# Patient Record
Sex: Male | Born: 1960 | Race: White | Hispanic: No | Marital: Married | Smoking: Former smoker
Health system: Southern US, Community
[De-identification: ages and names within clinical notes are randomized; demographics above are authoritative.]

## PROBLEM LIST (undated history)

## (undated) DIAGNOSIS — K219 Gastro-esophageal reflux disease without esophagitis: Secondary | ICD-10-CM

## (undated) DIAGNOSIS — I10 Essential (primary) hypertension: Secondary | ICD-10-CM

---

## 2014-06-08 ENCOUNTER — Ambulatory Visit: Payer: Self-pay | Admitting: Physician Assistant

## 2014-09-08 ENCOUNTER — Encounter: Payer: Self-pay | Admitting: *Deleted

## 2014-09-08 ENCOUNTER — Emergency Department
Admission: EM | Admit: 2014-09-08 | Discharge: 2014-09-08 | Disposition: A | Discharge status: Home / Self Care | Payer: BLUE CROSS/BLUE SHIELD | Attending: Student | Admitting: Student

## 2014-09-08 ENCOUNTER — Emergency Department: Payer: BLUE CROSS/BLUE SHIELD

## 2014-09-08 DIAGNOSIS — R0602 Shortness of breath: Secondary | ICD-10-CM | POA: Insufficient documentation

## 2014-09-08 DIAGNOSIS — R0789 Other chest pain: Secondary | ICD-10-CM | POA: Diagnosis present

## 2014-09-08 DIAGNOSIS — Z87891 Personal history of nicotine dependence: Secondary | ICD-10-CM | POA: Insufficient documentation

## 2014-09-08 DIAGNOSIS — R079 Chest pain, unspecified: Secondary | ICD-10-CM

## 2014-09-08 LAB — CBC
HEMATOCRIT: 48.1 % (ref 40.0–52.0)
Hemoglobin: 16.2 g/dL (ref 13.0–18.0)
MCH: 31.1 pg (ref 26.0–34.0)
MCHC: 33.6 g/dL (ref 32.0–36.0)
MCV: 92.7 fL (ref 80.0–100.0)
Platelets: 277 10*3/uL (ref 150–440)
RBC: 5.19 MIL/uL (ref 4.40–5.90)
RDW: 13.8 % (ref 11.5–14.5)
WBC: 9.6 10*3/uL (ref 3.8–10.6)

## 2014-09-08 LAB — BASIC METABOLIC PANEL
Anion gap: 7 (ref 5–15)
BUN: 21 mg/dL — ABNORMAL HIGH (ref 6–20)
CO2: 27 mmol/L (ref 22–32)
CREATININE: 1.12 mg/dL (ref 0.61–1.24)
Calcium: 9.1 mg/dL (ref 8.9–10.3)
Chloride: 107 mmol/L (ref 101–111)
Glucose, Bld: 93 mg/dL (ref 65–99)
Potassium: 4.1 mmol/L (ref 3.5–5.1)
SODIUM: 141 mmol/L (ref 135–145)

## 2014-09-08 LAB — TROPONIN I: Troponin I: <0.03 ng/mL (ref <0.031)

## 2014-09-08 MED ORDER — ASPIRIN 81 MG PO CHEW
CHEWABLE_TABLET | ORAL | Status: AC
  Administered 2014-09-08: 324 mg via ORAL
  Filled 2014-09-08: qty 4

## 2014-09-08 MED ORDER — ASPIRIN 81 MG PO CHEW
324.0000 mg | CHEWABLE_TABLET | Freq: Once | ORAL | Status: AC
  Administered 2014-09-08: 324 mg via ORAL

## 2014-09-08 NOTE — Discharge Instructions (Signed)
Return immediately for severe or worsening chest pressure or shortness of breath, lightheadedness, sudden sweating, nausea vomiting, if your pain starts to radiate into your neck arms or back, fainting, fevers, abdominal pain, vomiting, or for any other concerns.

## 2014-09-08 NOTE — ED Provider Notes (Signed)
[facility] Emergency Department Provider Note  ____________________________________________  Time seen: Approximately 9:05 PM  I have reviewed the triage vital signs and the nursing notes.   HISTORY  Chief Complaint Chest Pain    HPI Cole Elliott is a 54 y.o. male with history of depression, no other chronic medical problems, history of smoking but quit 10 years ago who presents for evaluation of 2 weeks of constant anterior chest tightness/pressure, gradual onset, not worsened with exertion, not pleuritic. He reports he does not have one moment without the discomfort. It typically improves when he gets up and moves around. Chest pressure is associated with some mild shortness of breath but is not associated with any diaphoresis, no nausea, no vomiting, no lightheadedness or fainting. Current severity is mild. No modifying factors. The chest pressure is nonradiating. He has no history of coronary artery disease, no family history of coronary artery disease, he has no history of PE or DVT, no hemoptysis, no estrogen aor testosterone use, no recent surgeries, no recent period of Immobilization. His wife returned from a trip from the [place] today, he told her of his symptoms and she forced him to come to the emergency department.   History reviewed. No pertinent past medical history.  There are no active problems to display for this patient.   History reviewed. No pertinent past surgical history.  No current outpatient prescriptions on file.  Allergies Review of patient's allergies indicates no known allergies.  No family history on file.  Social History History  Substance Use Topics  . Smoking status: Former Games developer  . Smokeless tobacco: Not on file  . Alcohol Use: No    Review of Systems Constitutional: No fever/chills Eyes: No visual changes. ENT: No sore throat. Cardiovascular: + chest pain. Respiratory: +shortness of  breath. Gastrointestinal: No abdominal pain.  No nausea, no vomiting.  No diarrhea.  No constipation. Genitourinary: Negative for dysuria. Musculoskeletal: Negative for back pain. Skin: Negative for rash. Neurological: Negative for headaches, focal weakness or numbness.  10-point ROS otherwise negative.  ____________________________________________   PHYSICAL EXAM:  VITAL SIGNS: ED Triage Vitals  Enc Vitals Group     BP 09/08/14 1640 151/83 mmHg     Pulse Rate 09/08/14 1640 92     Resp 09/08/14 1640 20     Temp 09/08/14 1640 97.8 F (36.6 C)     Temp Source 09/08/14 1640 Oral     SpO2 09/08/14 1640 94 %     Weight 09/08/14 1640 272 lb (123.378 kg)     Height 09/08/14 1640 6' (1.829 m)     Head Cir --      Peak Flow --      Pain Score 09/08/14 1639 2     Pain Loc --      Pain Edu? --      Excl. in GC? --     Constitutional: Alert and oriented. Well appearing and in no acute distress. Eyes: Conjunctivae are normal. PERRL. EOMI. Head: Atraumatic. Nose: No congestion/rhinnorhea. Mouth/Throat: Mucous membranes are moist.  Oropharynx non-erythematous. Neck: No stridor.  Cardiovascular: Normal rate, regular rhythm. Grossly normal heart sounds.  Good peripheral circulation. Respiratory: Normal respiratory effort.  No retractions. Lungs CTAB. Gastrointestinal: Soft and nontender. No distention. No abdominal bruits. No CVA tenderness. Genitourinary: deferred Musculoskeletal: No lower extremity tenderness nor edema.  No joint effusions. Neurologic:  Normal speech and language. No gross focal neurologic deficits are appreciated. Speech is normal. No gait instability. Skin:  Skin  is warm, dry and intact. No rash noted. Psychiatric: Mood and affect are normal. Speech and behavior are normal.  ____________________________________________   LABS (all labs ordered are listed, but only abnormal results are displayed)  Labs Reviewed  BASIC METABOLIC PANEL - Abnormal; Notable for  the following:    BUN 21 (*)    All other components within normal limits  CBC  TROPONIN I   ____________________________________________  EKG  ED ECG REPORT I, Gayla Doss, the attending physician, personally viewed and interpreted this ECG.   Date: 09/08/2014  EKG Time: 16:43  Rate: 89  Rhythm: normal sinus rhythm  Axis: Normal axis  Intervals:none  ST&T Change: No acute ST segment elevation  ____________________________________________  RADIOLOGY  CXR IMPRESSION: 6 mm nodular opacity in the right upper lobe near the apex. Advise noncontrast enhanced chest CT to further assess. Lungs elsewhere clear. Heart size and pulmonary vascularity are normal. No adenopathy.   ____________________________________________   PROCEDURES  Procedure(s) performed: None  Critical Care performed: No  ____________________________________________   INITIAL IMPRESSION / ASSESSMENT AND PLAN / ED COURSE  Pertinent labs & imaging results that were available during my care of the patient were reviewed by me and considered in my medical decision making (see chart for details).  Cole Elliott is a 54 y.o. male with history of depression, no other chronic medical problems, history of smoking but quit 10 years ago who presents for evaluation of 2 weeks of constant anterior chest tightness/pressure, gradual onset, not worsened with exertion, not pleuritic. On Exam, he is very well-appearing and in no acute distress. Vital signs stable, he is afebrile. In the setting of continued/constant chest pressure for the past 2 weeks, no exertional component, doubt ACS. Heart score is 1 because of prior smoking history, troponin negative, EKG reassuring. He is not hypoxic, no increased work of breathing, no tachypnea, no hemoptysis, no risk factors for PE, no pleuritic component to his chest pain and I doubt PE as a diagnosis. Chest pain is not ripping or tearing in nature, does not radiate to the  back or down towards the feet and I doubt acute aortic dissection. Aspirin given. Discussed that he needs to follow-up soon with a primary care doctor. Discussed return precautions and he is comfortable with discharge plan, as is his wife. ____________________________________________   FINAL CLINICAL IMPRESSION(S) / ED DIAGNOSES  Final diagnoses:  Chest pain, unspecified chest pain type  SOB (shortness of breath)      Gayla Doss, MD 09/08/14 2255

## 2014-09-08 NOTE — ED Notes (Signed)
Past few weeks c/o chest pressure, some sob

## 2016-11-12 ENCOUNTER — Ambulatory Visit
Admission: EM | Admit: 2016-11-12 | Discharge: 2016-11-12 | Disposition: A | Discharge status: Home / Self Care | Payer: BLUE CROSS/BLUE SHIELD | Attending: Family Medicine | Admitting: Family Medicine

## 2016-11-12 ENCOUNTER — Encounter: Payer: Self-pay | Admitting: *Deleted

## 2016-11-12 DIAGNOSIS — Z024 Encounter for examination for driving license: Secondary | ICD-10-CM

## 2016-11-12 HISTORY — DX: Essential (primary) hypertension: I10

## 2016-11-12 HISTORY — DX: Gastro-esophageal reflux disease without esophagitis: K21.9

## 2016-11-12 LAB — DEPT OF TRANSP DIPSTICK, URINE (ARMC ONLY)
Glucose, UA: NEGATIVE mg/dL
HGB URINE DIPSTICK: NEGATIVE
Protein, ur: NEGATIVE mg/dL
Specific Gravity, Urine: 1.025 (ref 1.005–1.030)

## 2016-11-12 NOTE — ED Provider Notes (Signed)
[facility]    CSN: [id number] Arrival date & time: 11/12/16  1301     History   Chief Complaint Chief Complaint  Patient presents with  . Commercial Driver's License Exam    HPI Cole Elliott is a 56 y.o. male.   Patient's here for DOT. His hypertension on blood pressure medicine   The history is provided by the patient. No language interpreter was used.    Past Medical History:  Diagnosis Date  . GERD (gastroesophageal reflux disease)   . Hypertension     There are no active problems to display for this patient.   History reviewed. No pertinent surgical history.     Home Medications    Prior to Admission medications   Medication Sig Start Date End Date Taking? Authorizing Provider  aspirin EC 81 MG tablet Take 81 mg by mouth daily.   Yes [provider]  Glucosamine-Chondroitin-MSM-D (TRIPLE FLEX/VITAMIN D3 PO) Take by mouth daily.   Yes [provider]  lansoprazole (PREVACID) 30 MG capsule Take 30 mg by mouth 2 (two) times daily before a meal.   Yes [provider]  losartan (COZAAR) 50 MG tablet Take 50 mg by mouth daily.   Yes [provider]  Multiple Vitamins-Minerals (CENTRUM SILVER PO) Take by mouth daily.   Yes [provider]    Family History History reviewed. No pertinent family history.  Social History Social History  Substance Use Topics  . Smoking status: Former Games developermoker  . Smokeless tobacco: Never Used  . Alcohol use No     Allergies   Patient has no known allergies.   Review of Systems Review of Systems  All other systems reviewed and are negative.    Physical Exam Triage Vital Signs ED Triage Vitals  Enc Vitals Group     BP 11/12/16 1321 124/74     Pulse Rate 11/12/16 1321 89     Resp 11/12/16 1321 16     Temp 11/12/16 1321 98.4 F (36.9 C)     Temp Source 11/12/16 1321 Oral     SpO2 11/12/16 1321 97 %     Weight 11/12/16 1321 268 lb (121.6 kg)     Height  11/12/16 1321 6' (1.829 m)     Head Circumference --      Peak Flow --      Pain Score 11/12/16 1322 0     Pain Loc --      Pain Edu? --      Excl. in GC? --    No data found.   Updated Vital Signs BP 124/74 (BP Location: Left Arm)   Pulse 89   Temp 98.4 F (36.9 C) (Oral)   Resp 16   Ht 6' (1.829 m)   Wt 268 lb (121.6 kg)   SpO2 97%   BMI 36.35 kg/m   Visual Acuity Right Eye Distance:   Left Eye Distance:   Bilateral Distance:    Right Eye Near:   Left Eye Near:    Bilateral Near:     Physical Exam  Constitutional: He is oriented to person, place, and time. He appears well-developed and well-nourished.  HENT:  Head: Normocephalic and atraumatic.  Right Ear: External ear normal.  Left Ear: External ear normal.  Eyes: Pupils are equal, round, and reactive to light.  Neck: Normal range of motion. Neck supple.  Cardiovascular: Normal rate and regular rhythm.   Pulmonary/Chest: Effort normal.  Abdominal: Hernia confirmed negative in the  right inguinal area and confirmed negative in the left inguinal area.  Genitourinary: Testes normal and penis normal. Uncircumcised.  Musculoskeletal: Normal range of motion.  Neurological: He is alert and oriented to person, place, and time. No cranial nerve deficit.  Skin: Skin is warm.  Psychiatric: He has a normal mood and affect.  Vitals reviewed.    UC Treatments / Results  Labs (all labs ordered are listed, but only abnormal results are displayed) Labs Reviewed  DEPT OF TRANSP DIPSTICK, URINE([facility] ONLY)    EKG  EKG Interpretation None       Radiology No results found.  Procedures Procedures (including critical care time)  Medications Ordered in UC Medications - No data to display   Initial Impression / Assessment and Plan / UC Course  I have reviewed the triage vital signs and the nursing notes.  Pertinent labs & imaging results that were available during my care of the patient were  reviewed by me and considered in my medical decision making (see chart for details).    Patient given a year DOT  Final Clinical Impressions(s) / UC Diagnoses   Final diagnoses:  Encounter for commercial driver medical examination (CDME)    New Prescriptions New Prescriptions   No medications on file   Note: This dictation was prepared with Dragon dictation along with smaller phrase technology. Any transcriptional errors that result from this process are unintentional. Controlled Substance Prescriptions [place] Controlled Substance Registry consulted? Not Applicable   Hassan Rowan, MD 11/12/16 [phone]

## 2016-11-12 NOTE — ED Triage Notes (Signed)
Commercial drivers license physical exam 

## 2017-03-01 ENCOUNTER — Encounter: Payer: Self-pay | Admitting: Emergency Medicine

## 2017-03-01 ENCOUNTER — Emergency Department
Admission: EM | Admit: 2017-03-01 | Discharge: 2017-03-02 | Disposition: A | Discharge status: Home / Self Care | Payer: BLUE CROSS/BLUE SHIELD | Attending: Emergency Medicine | Admitting: Emergency Medicine

## 2017-03-01 ENCOUNTER — Other Ambulatory Visit: Payer: Self-pay

## 2017-03-01 ENCOUNTER — Emergency Department: Payer: BLUE CROSS/BLUE SHIELD

## 2017-03-01 DIAGNOSIS — Z87891 Personal history of nicotine dependence: Secondary | ICD-10-CM | POA: Insufficient documentation

## 2017-03-01 DIAGNOSIS — Z7982 Long term (current) use of aspirin: Secondary | ICD-10-CM | POA: Diagnosis not present

## 2017-03-01 DIAGNOSIS — K529 Noninfective gastroenteritis and colitis, unspecified: Secondary | ICD-10-CM | POA: Diagnosis not present

## 2017-03-01 DIAGNOSIS — I10 Essential (primary) hypertension: Secondary | ICD-10-CM | POA: Insufficient documentation

## 2017-03-01 DIAGNOSIS — Z79899 Other long term (current) drug therapy: Secondary | ICD-10-CM | POA: Insufficient documentation

## 2017-03-01 DIAGNOSIS — R112 Nausea with vomiting, unspecified: Secondary | ICD-10-CM | POA: Diagnosis present

## 2017-03-01 LAB — CBC
HCT: 51.1 % (ref 40.0–52.0)
Hemoglobin: 17.4 g/dL (ref 13.0–18.0)
MCH: 30.9 pg (ref 26.0–34.0)
MCHC: 33.9 g/dL (ref 32.0–36.0)
MCV: 91.1 fL (ref 80.0–100.0)
Platelets: 297 10*3/uL (ref 150–440)
RBC: 5.61 MIL/uL (ref 4.40–5.90)
RDW: 13.7 % (ref 11.5–14.5)
WBC: 15.3 10*3/uL — ABNORMAL HIGH (ref 3.8–10.6)

## 2017-03-01 LAB — URINALYSIS, COMPLETE (UACMP) WITH MICROSCOPIC
Bacteria, UA: NONE SEEN
Bilirubin Urine: NEGATIVE
GLUCOSE, UA: NEGATIVE mg/dL
Ketones, ur: NEGATIVE mg/dL
Leukocytes, UA: NEGATIVE
Nitrite: NEGATIVE
PROTEIN: 30 mg/dL — AB
Specific Gravity, Urine: 1.031 — ABNORMAL HIGH (ref 1.005–1.030)
pH: 5 (ref 5.0–8.0)

## 2017-03-01 LAB — COMPREHENSIVE METABOLIC PANEL
ALK PHOS: 75 U/L (ref 38–126)
ALT: 37 U/L (ref 17–63)
AST: 35 U/L (ref 15–41)
Albumin: 4.4 g/dL (ref 3.5–5.0)
Anion gap: 9 (ref 5–15)
BUN: 26 mg/dL — AB (ref 6–20)
CALCIUM: 9 mg/dL (ref 8.9–10.3)
CO2: 24 mmol/L (ref 22–32)
Chloride: 106 mmol/L (ref 101–111)
Creatinine, Ser: 0.99 mg/dL (ref 0.61–1.24)
GFR calc Af Amer: >60 mL/min (ref >60)
GFR calc non Af Amer: >60 mL/min (ref >60)
Glucose, Bld: 132 mg/dL — ABNORMAL HIGH (ref 65–99)
Potassium: 3.4 mmol/L — ABNORMAL LOW (ref 3.5–5.1)
Sodium: 139 mmol/L (ref 135–145)
Total Bilirubin: 0.9 mg/dL (ref 0.3–1.2)
Total Protein: 7.6 g/dL (ref 6.5–8.1)

## 2017-03-01 LAB — LIPASE, BLOOD: Lipase: 23 U/L (ref 11–51)

## 2017-03-01 MED ORDER — SODIUM CHLORIDE 0.9 % IV BOLUS (SEPSIS)
1000.0000 mL | Freq: Once | INTRAVENOUS | Status: AC
  Administered 2017-03-01: 1000 mL via INTRAVENOUS

## 2017-03-01 MED ORDER — MORPHINE SULFATE (PF) 2 MG/ML IV SOLN
2.0000 mg | Freq: Once | INTRAVENOUS | Status: AC
  Administered 2017-03-01: 2 mg via INTRAVENOUS
  Filled 2017-03-01: qty 1

## 2017-03-01 MED ORDER — IOPAMIDOL (ISOVUE-300) INJECTION 61%
30.0000 mL | Freq: Once | INTRAVENOUS | Status: AC
  Administered 2017-03-01: 30 mL via ORAL

## 2017-03-01 MED ORDER — ONDANSETRON HCL 4 MG/2ML IJ SOLN
4.0000 mg | Freq: Once | INTRAMUSCULAR | Status: AC
  Administered 2017-03-01: 4 mg via INTRAVENOUS
  Filled 2017-03-01: qty 2

## 2017-03-01 MED ORDER — SODIUM CHLORIDE 0.9 % IV BOLUS (SEPSIS)
1000.0000 mL | Freq: Once | INTRAVENOUS | Status: AC
  Administered 2017-03-02: 1000 mL via INTRAVENOUS

## 2017-03-01 MED ORDER — IOPAMIDOL (ISOVUE-300) INJECTION 61%
100.0000 mL | Freq: Once | INTRAVENOUS | Status: AC | PRN
  Administered 2017-03-01: 100 mL via INTRAVENOUS

## 2017-03-01 NOTE — ED Notes (Signed)
Pt back from ct

## 2017-03-01 NOTE — ED Triage Notes (Signed)
First nurse note, pt states that he has been nauseated and vomiting, states that he thinks he either has a stomach bug or ate something bad, pt refused wheelchair and states that he thinks he may be getting dehydrated

## 2017-03-01 NOTE — ED Provider Notes (Addendum)
[facility] Emergency Department Provider Note   ____________________________________________   First MD Initiated Contact with Patient 03/01/17 2044     (approximate)  I have reviewed the triage vital signs and the nursing notes.   HISTORY  Chief Complaint Abdominal Pain and Emesis    HPI Cole Elliott is a 56 y.o. male Patient reports onset of nausea and vomiting earlier today. His abdomen is slightly distended. He reports his belly is achy thinks it might be from vomiting so much straining his muscles. He has a low-grade fever.abdominal pain is mild to moderate. Achy in nature not crampy.   Past Medical History:  Diagnosis Date  . GERD (gastroesophageal reflux disease)   . Hypertension     There are no active problems to display for this patient.   History reviewed. No pertinent surgical history.  Prior to Admission medications   Medication Sig Start Date End Date Taking? Authorizing Provider  lansoprazole (PREVACID) 30 MG capsule Take 30 mg by mouth 2 (two) times daily before a meal.   Yes [provider]  losartan (COZAAR) 50 MG tablet Take 50 mg by mouth daily.   Yes [provider]  aspirin EC 81 MG tablet Take 81 mg by mouth daily.    [provider]  Glucosamine-Chondroitin-MSM-D (TRIPLE FLEX/VITAMIN D3 PO) Take 1 capsule by mouth daily.     [provider]  Multiple Vitamins-Minerals (CENTRUM SILVER PO) Take 1 tablet by mouth daily.     [provider]  ondansetron (ZOFRAN ODT) 4 MG disintegrating tablet Take 1 tablet (4 mg total) by mouth every 8 (eight) hours as needed for nausea or vomiting. 03/02/17   Arnaldo NatalMalinda, Paul F, MD    Allergies Patient has no known allergies.  History reviewed. No pertinent family history.  Social History Social History   Tobacco Use  . Smoking status: Former Games developermoker  . Smokeless tobacco: Never Used  Substance Use Topics  . Alcohol use: No  . Drug use:  No    Review of Systems  Constitutional: No fever/chillsthat he knows of Eyes: No visual changes. ENT: No sore throat. Cardiovascular: Denies chest pain. Respiratory: Denies shortness of breath. Gastrointestinal: see history of present illness Genitourinary: Negative for dysuria. Musculoskeletal: Negative for back pain. Skin: Negative for rash. Neurological: Negative for headaches, focal weakness  ____________________________________________   PHYSICAL EXAM:  VITAL SIGNS: ED Triage Vitals  Enc Vitals Group     BP 03/01/17 1919 (!) 175/89     Pulse Rate 03/01/17 1919 (!) 125     Resp 03/01/17 1919 20     Temp 03/01/17 1919 99.6 F (37.6 C)     Temp Source 03/01/17 1919 Oral     SpO2 03/01/17 1919 95 %     Weight 03/01/17 1920 268 lb (121.6 kg)     Height 03/01/17 1920 6' (1.829 m)     Head Circumference --      Peak Flow --      Pain Score --      Pain Loc --      Pain Edu? --      Excl. in GC? --     Constitutional: Alert and oriented. Well appearing and in no acute distress. Eyes: Conjunctivae are normal.  Head: Atraumatic. Nose: No congestion/rhinnorhea. Mouth/Throat: Mucous membranes are moist.  Oropharynx non-erythematous. Neck: No stridor. Cardiovascular: Normal rate, regular rhythm. Grossly normal heart sounds.  Good peripheral circulation. Respiratory: Normal respiratory effort.  No retractions. Lungs CTAB.  Gastrointestinal: Soft andmildly diffusely tender to palpation only. No distention. No abdominal bruits. No CVA tenderness. Musculoskeletal: No lower extremity tenderness nor edema.  No joint effusions. Neurologic:  Normal speech and language. No gross focal neurologic deficits are appreciated. No gait instability. Skin:  Skin is warm, dry and intact. No rash noted. Psychiatric: Mood and affect are normal. Speech and behavior are normal.  ____________________________________________   LABS (all labs ordered are listed, but only abnormal results are  displayed)  Labs Reviewed  COMPREHENSIVE METABOLIC PANEL - Abnormal; Notable for the following components:      Result Value   Potassium 3.4 (*)    Glucose, Bld 132 (*)    BUN 26 (*)    All other components within normal limits  CBC - Abnormal; Notable for the following components:   WBC 15.3 (*)    All other components within normal limits  URINALYSIS, COMPLETE (UACMP) WITH MICROSCOPIC - Abnormal; Notable for the following components:   Color, Urine YELLOW (*)    APPearance CLEAR (*)    Specific Gravity, Urine 1.031 (*)    Hgb urine dipstick SMALL (*)    Protein, ur 30 (*)    Squamous Epithelial / LPF 0-5 (*)    All other components within normal limits  LIPASE, BLOOD   ____________________________________________  EKG   ____________________________________________  RADIOLOGY  Ct Abdomen Pelvis W Contrast  Result Date: 03/01/2017 CLINICAL DATA:  Abdominal distention with nausea and vomiting EXAM: CT ABDOMEN AND PELVIS WITH CONTRAST TECHNIQUE: Multidetector CT imaging of the abdomen and pelvis was performed using the standard protocol following bolus administration of intravenous contrast. Oral contrast was also administered. CONTRAST:  100mL ISOVUE-300 IOPAMIDOL (ISOVUE-300) INJECTION 61% COMPARISON:  None. FINDINGS: Lower chest: Lung bases are clear. There is a hiatal hernia with reflux into the distal esophagus. There is wall thickening in the hiatal hernia. Hepatobiliary: There is a degree of hepatic steatosis. No focal liver lesions are evident. There is no appreciable gallbladder wall thickening. No biliary duct dilatation. Pancreas: There is no pancreatic mass or inflammatory focus. No pancreatic lesions are evident. Spleen: No splenic lesions are evident. Adrenals/Urinary Tract: Adrenals bilaterally appear normal. Kidneys bilaterally show no evident mass or hydronephrosis on either side. There is no renal or ureteral calculus on either side. Urinary bladder is midline with  wall thickness within normal limits. Stomach/Bowel: Stomach is distended with fluid. There are multiple loops of bowel which are largely filled with fluid. There is dilatation of most of the loops of jejunum. There is a relative transition zone in the proximal to mid ileum suggesting a degree of bowel obstruction. No free air or portal venous air. There is no colonic dilatation. There is fluid throughout much of the colon. Vascular/Lymphatic: There are foci of atherosclerotic calcification in the aorta and common iliac arteries. No aneurysm evident. Major mesenteric vessels appear intact. There is no mesenteric vessel obstruction. There is no appreciable adenopathy in the abdomen or pelvis. Reproductive: Prostate and seminal vesicles are normal in size and contour. There is no pelvic mass. Other: Appendix appears normal. There is no ascites or abscess in the abdomen or pelvis. There is a rather minimal ventral hernia containing only fat. There is fat in each inguinal ring. Musculoskeletal: There is degenerative change in the lumbar spine with vacuum phenomenon at L5-S1. There are no blastic or lytic bone lesions. There is no intramuscular or abdominal wall lesion. IMPRESSION: 1. Dilatation of the stomach and small bowel to the mid right abdominal  region where there is a relative transition zone in the proximal to mid ileum, felt to represent a degree of small bowel obstruction. No free air or portal venous air. Suspect a degree of underlying enterocolitis within the degree of fluid throughout the small bowel and colon. 2.  Appendix appears normal.  No abscess. 3. There is spontaneous reflux into a hiatal hernia. There is wall thickening in the hiatal hernia, likely due to distal esophagitis. 4. Minimal ventral hernia containing fat. Fat noted in each inguinal ring. 5.  No renal or ureteral calculus.  No hydronephrosis. 6.  There is aortoiliac atherosclerosis. 7.  Hepatic steatosis. Aortic Atherosclerosis  (ICD10-I70.0). Electronically Signed   By: Bretta Bang III M.D.   On: 03/01/2017 22:26   surgery will see the patient for his degree of small bowel obstruction ____________________________________________   PROCEDURES  Procedure(s) performed:   Procedures  Critical Care performed:   ____________________________________________   INITIAL IMPRESSION / ASSESSMENT AND PLAN / ED COURSE  CT shows what appears to be a partial small bowel obstruction. Patient is passing stool etc. per rectum at the obstruction is fairly high. His belly still somewhat distended. Surgery will see him most likely will admit him.   ----------------------------------------- 12:12 AM on 03/02/2017 -----------------------------------------  Surgery comes to see the patient feel he does not have a partial small bowel obstruction as he has no history of previous bowel surgery he has been passing gas and stool and otherwise looks okay we will discharge the patient with some Zofran and Pepto-Bismol if need be he looks good at present. patient will return if he gets worse again.  ____________________________________________   FINAL CLINICAL IMPRESSION(S) / ED DIAGNOSES  Final diagnoses:  Nausea and vomiting, intractability of vomiting not specified, unspecified vomiting type  Gastroenteritis     ED Discharge Orders        Ordered    ondansetron (ZOFRAN ODT) 4 MG disintegrating tablet  Every 8 hours PRN     03/02/17 0011       Note:  This document was prepared using Dragon voice recognition software and may include unintentional dictation errors.    Arnaldo Natal, MD 03/01/17 2316    Arnaldo Natal, MD 03/02/17 [phone]

## 2017-03-01 NOTE — ED Triage Notes (Signed)
Pt presents to ED via POV c/o sudden onset generalized abd pain and emesis starting today. Pt states abd pain feels like muscle aching from vomiting.

## 2017-03-02 MED ORDER — ONDANSETRON 4 MG PO TBDP: 4.0000 mg | ORAL_TABLET | Freq: Three times a day (TID) | ORAL | 0 refills | Status: AC | PRN

## 2017-03-02 NOTE — Discharge Instructions (Signed)
use Pepto-Bismol for the diarrhea you can take the Zofran melt on your tongue pill 3 times a day if needed for nausea or vomiting. Return here for fever lightheadedness continuing diarrhea or if you're unable to keep down fluids.

## 2017-03-02 NOTE — Consult Note (Signed)
SURGICAL CONSULTATION NOTE (initial) - cpt: [phone]  HISTORY OF PRESENT ILLNESS (HPI):  56 y.o. male with no prior abdominal surgery, no known or symptomatic hernias, and no family history of GI cancers presented to [facility] ED tonight for evaluation of nausea, non-bloody vomiting, and diarrhea with intermittent wave-like "achy" abdominal discomfort. Patient reports he first developed nausea and vomiting after eating at [employer] ~10:30 pm last night, followed by several episodes of diarrhea. He states his last loose BM was this morning and he hasn't vomited again since presenting to [facility] ED tonight despite drinking large volume oral contrast, but he says he has been passing flatus and feels like he's about to have another BM. He also describes his "achy" epigastric abdominal pain began after several episodes of emesis and has improved over the past several hours. He otherwise denies any symptoms preceding his meal last night, abdominal distention, prior similar symptoms, fever/chills, recent weight loss, CP, or SOB.  Surgery is consulted by ED physician Dr. Darnelle Catalan in this context for evaluation and management of nausea/vomiting with diarrhea.  PAST MEDICAL HISTORY (PMH):  Past Medical History:  Diagnosis Date  . GERD (gastroesophageal reflux disease)   . Hypertension      PAST SURGICAL HISTORY (PSH):  History reviewed. No pertinent surgical history.   MEDICATIONS:  Prior to Admission medications   Medication Sig Start Date End Date Taking? Authorizing Provider  lansoprazole (PREVACID) 30 MG capsule Take 30 mg by mouth 2 (two) times daily before a meal.   Yes [provider]  losartan (COZAAR) 50 MG tablet Take 50 mg by mouth daily.   Yes [provider]  aspirin EC 81 MG tablet Take 81 mg by mouth daily.    [provider]  Glucosamine-Chondroitin-MSM-D (TRIPLE FLEX/VITAMIN D3 PO) Take 1 capsule by mouth daily.     [provider]  Multiple  Vitamins-Minerals (CENTRUM SILVER PO) Take 1 tablet by mouth daily.     [provider]  ondansetron (ZOFRAN ODT) 4 MG disintegrating tablet Take 1 tablet (4 mg total) by mouth every 8 (eight) hours as needed for nausea or vomiting. 03/02/17   Arnaldo Natal, MD     ALLERGIES:  No Known Allergies   SOCIAL HISTORY:  Social History   Socioeconomic History  . Marital status: Married    Spouse name: Not on file  . Number of children: Not on file  . Years of education: Not on file  . Highest education level: Not on file  Social Needs  . Financial resource strain: Not on file  . Food insecurity - worry: Not on file  . Food insecurity - inability: Not on file  . Transportation needs - medical: Not on file  . Transportation needs - non-medical: Not on file  Occupational History  . Not on file  Tobacco Use  . Smoking status: Former Games developer  . Smokeless tobacco: Never Used  Substance and Sexual Activity  . Alcohol use: No  . Drug use: No  . Sexual activity: Not on file  Other Topics Concern  . Not on file  Social History Narrative  . Not on file    The patient currently resides (home / rehab facility / nursing home): Home The patient normally is (ambulatory / bedbound): Ambulatory   FAMILY HISTORY:  History reviewed. No pertinent family history.   REVIEW OF SYSTEMS:  Constitutional: denies weight loss, fever, chills, or sweats  Eyes: denies any other vision changes, history of eye injury  ENT:  denies sore throat, hearing problems  Respiratory: denies shortness of breath, wheezing  Cardiovascular: denies chest pain, palpitations  Gastrointestinal: abdominal pain, N/V, and bowel function as per HPI Genitourinary: denies burning with urination or urinary frequency Musculoskeletal: denies any other joint pains or cramps  Skin: denies any other rashes or skin discolorations  Neurological: denies any other headache, dizziness, weakness  Psychiatric: denies any other  depression, anxiety   All other review of systems were negative   VITAL SIGNS:  Temp:  [99.6 F (37.6 C)] 99.6 F (37.6 C) (12/16 1919) Pulse Rate:  [102-125] 102 (12/16 2304) Resp:  [18-20] 18 (12/16 2304) BP: (129-175)/(82-89) 129/82 (12/16 2304) SpO2:  [95 %] 95 % (12/16 2304) Weight:  [268 lb (121.6 kg)] 268 lb (121.6 kg) (12/16 1920)     Height: 6' (182.9 cm) Weight: 268 lb (121.6 kg) BMI (Calculated): 36.34   INTAKE/OUTPUT:  This shift: No intake/output data recorded.  Last 2 shifts: @IOLAST2SHIFTS @   PHYSICAL EXAM:  Constitutional:  -- Obese body habitus  -- Awake, alert, and oriented x3, no apparent distress Eyes:  -- Pupils equally round and reactive to light  -- No scleral icterus, B/L no occular discharge Ear, nose, throat: -- Neck is FROM WNL -- No jugular venous distension  Pulmonary:  -- No wheezes or rhales -- Equal breath sounds bilaterally -- Breathing non-labored at rest Cardiovascular:  -- S1, S2 present  -- No pericardial rubs  Gastrointestinal:  -- Abdomen soft and obese with minimal vague epigastric tenderness to palpation, non-distended, no guarding or rebound tenderness -- No abdominal masses appreciated, pulsatile or otherwise  Musculoskeletal and Integumentary:  -- Wounds or skin discoloration: None appreciated -- Extremities: B/L UE and LE FROM, hands and feet warm  Neurologic:  -- Motor function: Intact and symmetric -- Sensation: Intact and symmetric Psychiatric:  -- Mood and affect WNL  Labs:  CBC Latest Ref Rng & Units 03/01/2017 09/08/2014  WBC 3.8 - 10.6 K/uL 15.3(H) 9.6  Hemoglobin 13.0 - 18.0 g/dL 09.817.4 11.916.2  Hematocrit 14.740.0 - 52.0 % 51.1 48.1  Platelets 150 - 440 K/uL 297 277   CMP Latest Ref Rng & Units 03/01/2017 09/08/2014  Glucose 65 - 99 mg/dL 829(F132(H) 93  BUN 6 - 20 mg/dL 62(Z26(H) 30(Q21(H)  Creatinine 0.61 - 1.24 mg/dL 6.570.99 8.461.12  Sodium 962135 - 145 mmol/L 139 141  Potassium 3.5 - 5.1 mmol/L 3.4(L) 4.1  Chloride 101 - 111 mmol/L  106 107  CO2 22 - 32 mmol/L 24 27  Calcium 8.9 - 10.3 mg/dL 9.0 9.1  Total Protein 6.5 - 8.1 g/dL 7.6 -  Total Bilirubin 0.3 - 1.2 mg/dL 0.9 -  Alkaline Phos 38 - 126 U/L 75 -  AST 15 - 41 U/L 35 -  ALT 17 - 63 U/L 37 -   Imaging studies:  CT Abdomen and Pelvis with Oral and IV Contrast (03/02/2017) - personally reviewed and discussed with patient and his wife (bedside) Dilatation of the stomach and small bowel to the mid right abdominal region where there is a relative transition zone in the proximal to mid ileum, felt to represent a degree of small bowel obstruction. No free air or portal venous air. Suspect a degree of underlying enterocolitis within the degree of fluid throughout the small bowel and colon. Minimal ventral hernia containing fat.  Fat noted in each inguinal ring.  Assessment/Plan: (ICD-10's: 62K52.9) 56 y.o. male with what seems more likely to be gastroenterocolitis, though partial bowel obstruction cannot be completely  excluded, considering a lack of any prior abdominal surgery, no bowel-containing abdominal wall hernia, and no family history of GI malignancy (and patient's relatively young age), along with ongoing flatus and diarrhea beginning last night without any preceding symptoms or abdominal distention, and fluid-filled colon as well as small intestine, complicated by pertinent comorbidities including morbid obesity (BMI >36), medication-controlled GERD with known hiatal hernia, and HTN.   - no indication for surgical intervention at this time   - importance of hydration discussed with patient and his wife  - if patient discharged by ED and abdominal pain worsens/persists, return to ED; otherwise, follow-up with PMD   - medical management and disposition as per ED physician  All of the above findings and recommendations were discussed with the patient, his wife, and Dr. Darnelle CatalanMalinda (ED physician), and all of patient's and his family's questions were answered to their  expressed satisfaction.  Thank you for the opportunity to participate in this patient's care.   -- Scherrie GerlachJason E. Earlene Plateravis, MD, RPVI [place]: [facility] - Partnering for exceptional care. Office: [phone]

## 2017-08-20 ENCOUNTER — Ambulatory Visit
Admission: RE | Admit: 2017-08-20 | Discharge: 2017-08-20 | Disposition: A | Discharge status: Home / Self Care | Payer: BLUE CROSS/BLUE SHIELD | Source: Ambulatory Visit | Attending: Family Medicine | Admitting: Family Medicine

## 2017-08-20 ENCOUNTER — Other Ambulatory Visit: Payer: Self-pay | Admitting: Family Medicine

## 2017-08-20 DIAGNOSIS — T5991XA Toxic effect of unspecified gases, fumes and vapors, accidental (unintentional), initial encounter: Secondary | ICD-10-CM

## 2017-08-20 DIAGNOSIS — X58XXXA Exposure to other specified factors, initial encounter: Secondary | ICD-10-CM | POA: Diagnosis not present

## 2018-07-23 ENCOUNTER — Ambulatory Visit
Admission: EM | Admit: 2018-07-23 | Discharge: 2018-07-23 | Disposition: A | Discharge status: Home / Self Care | Payer: BLUE CROSS/BLUE SHIELD

## 2018-07-23 NOTE — ED Notes (Signed)
Patient here for PPD placement. Placed to [facility] at 3:45pm. Patient will return Monday morning to have this read. Form placed in TB notebook.

## 2018-07-23 NOTE — ED Triage Notes (Signed)
Patient here for PPD placement

## 2018-07-26 ENCOUNTER — Other Ambulatory Visit: Payer: Self-pay

## 2018-07-26 ENCOUNTER — Ambulatory Visit
Admission: EM | Admit: 2018-07-26 | Discharge: 2018-07-26 | Disposition: A | Discharge status: Home / Self Care | Payer: BLUE CROSS/BLUE SHIELD

## 2018-07-26 NOTE — ED Triage Notes (Signed)
Patient here for PPD read. Results: 70mm  Negative

## 2019-10-11 IMAGING — CR DG CHEST 2V
3 series · 3 of 3 positions shown · non-contrast
Comparison: 09/08/2014 chest radiograph

CLINICAL DATA: Cough and chest pressure following inhalation of
noxious fumes 2 days ago.

EXAM:
CHEST - 2 VIEW

[chest pa]
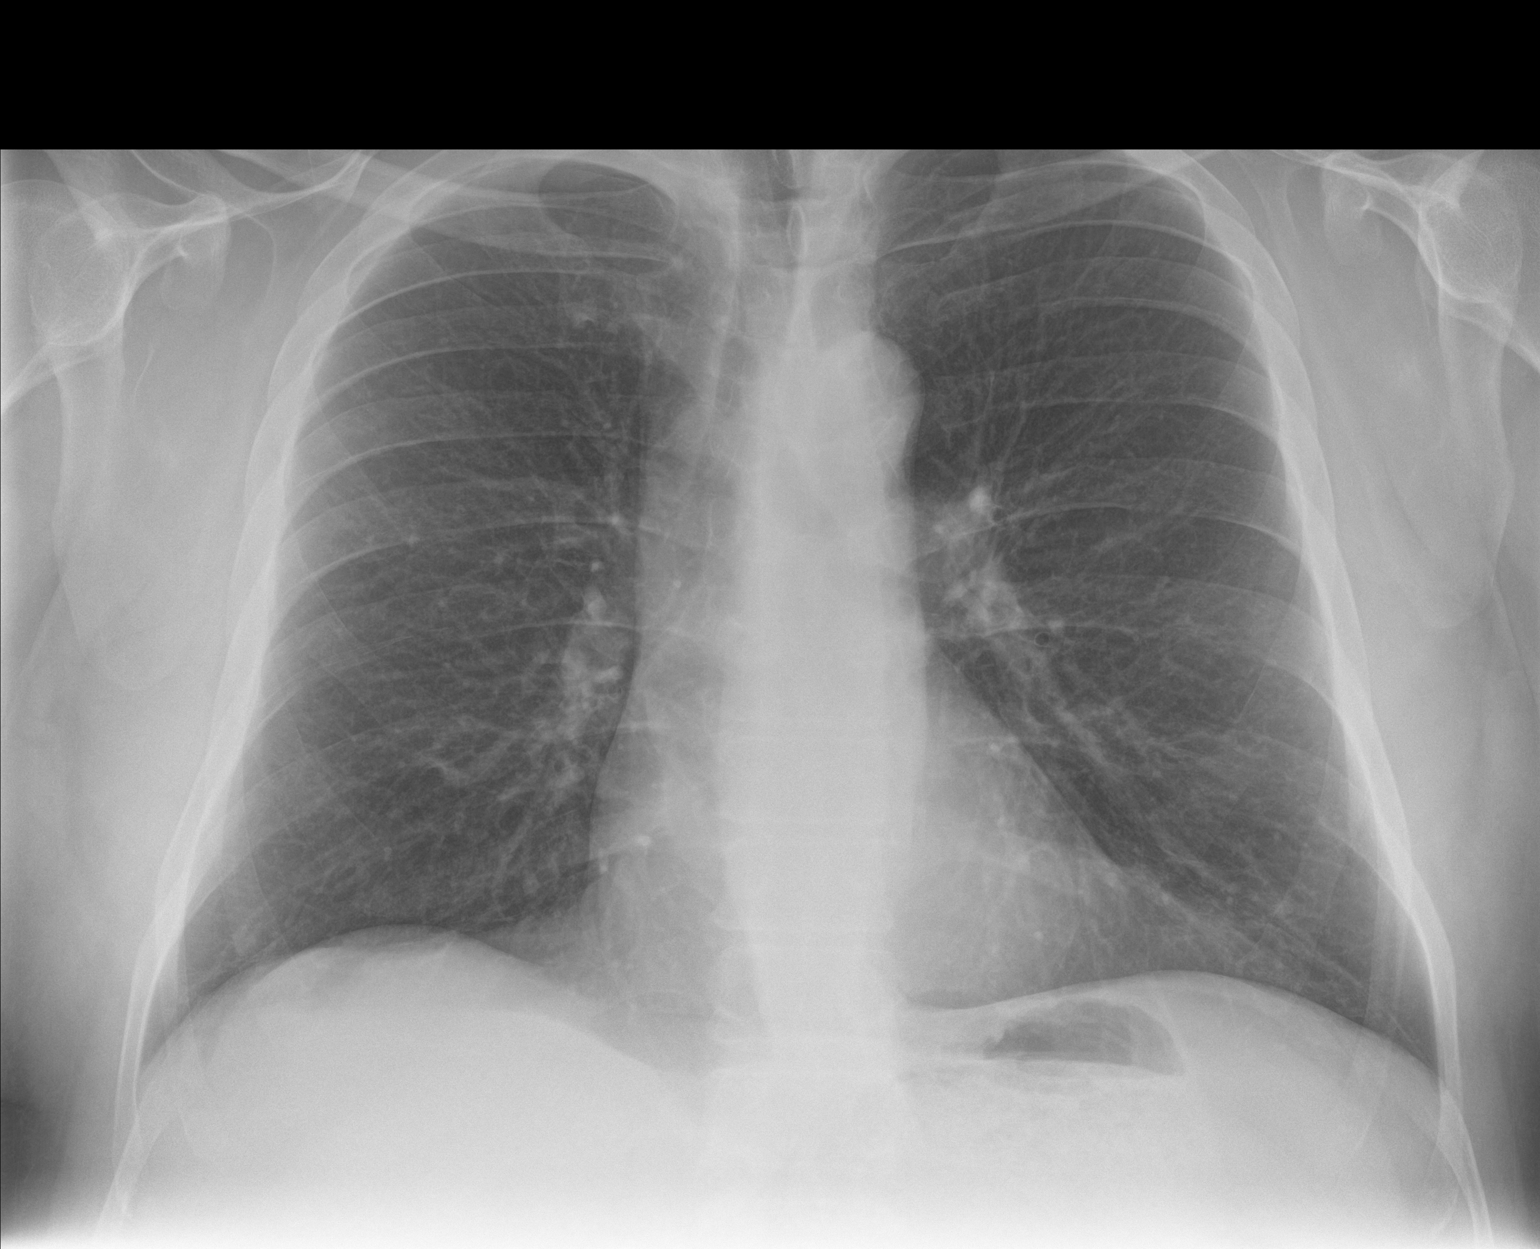

[chest lat (1 of 2)]
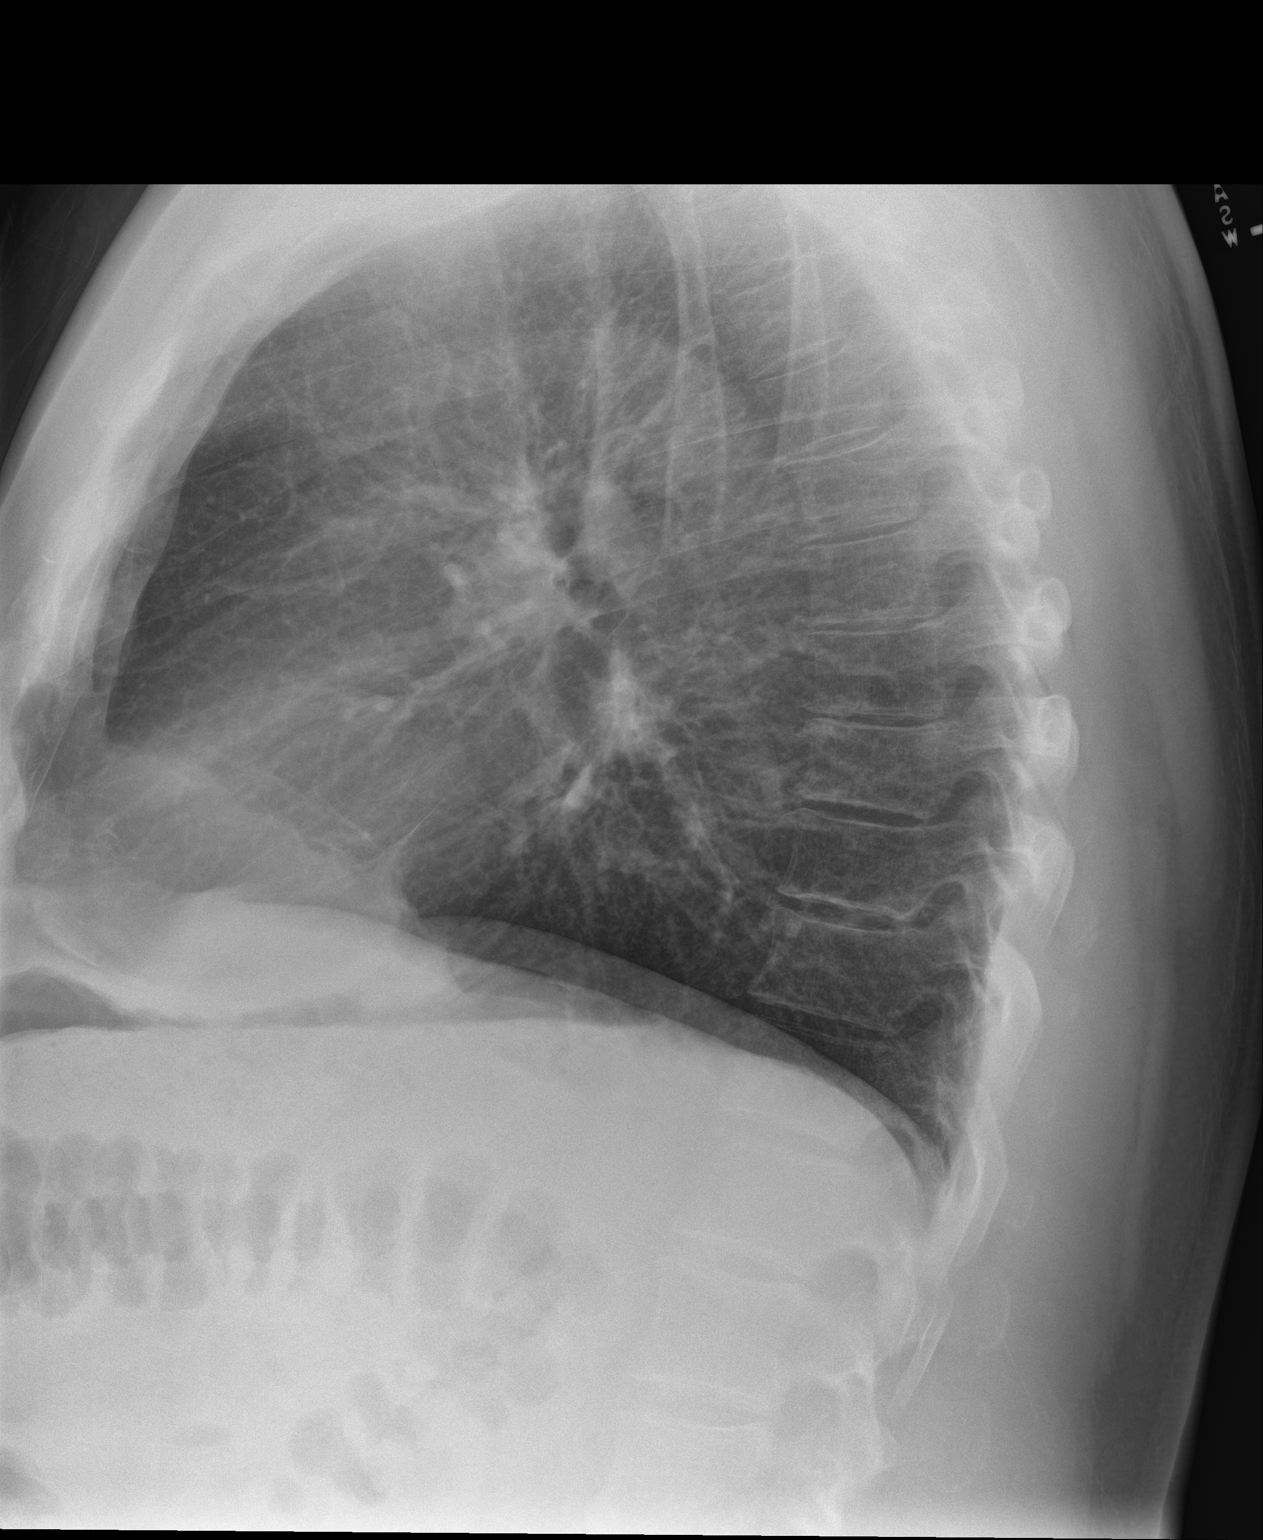

[chest lat (2 of 2)]
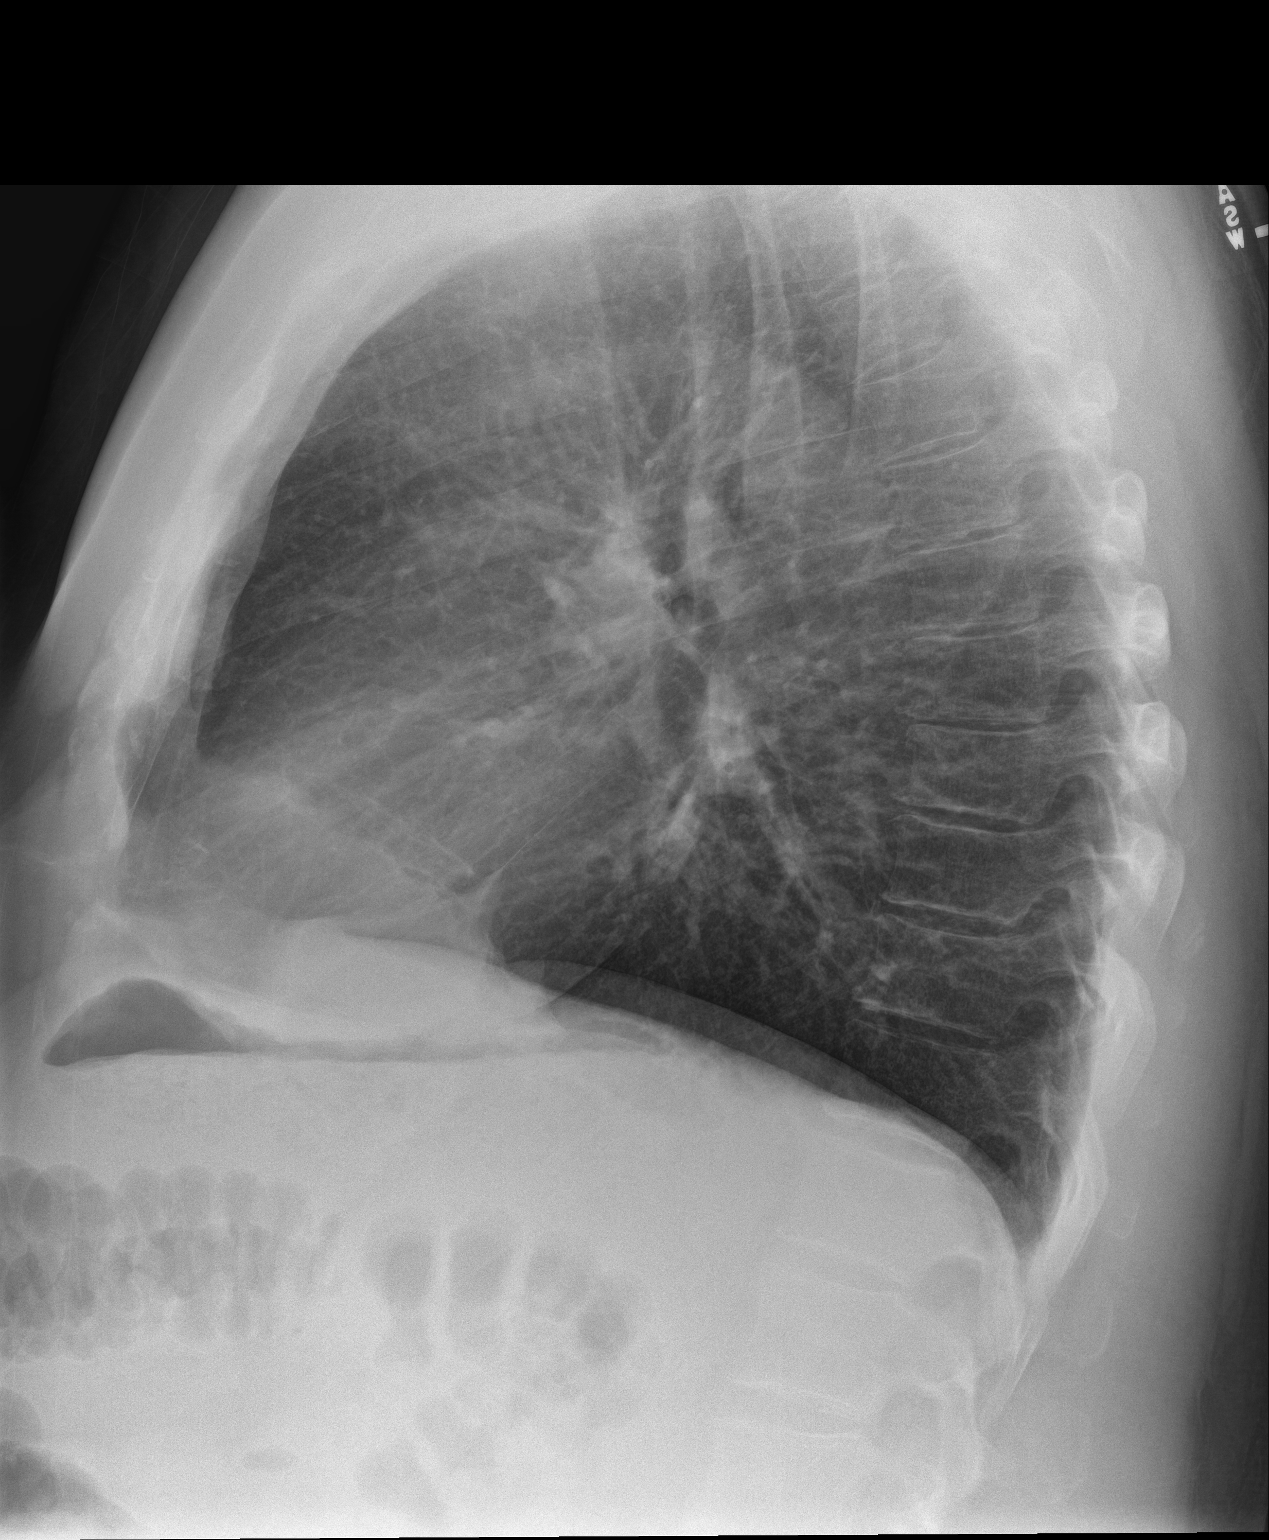

[3 of 3 positions shown; findings below may reference images not displayed]

FINDINGS: The cardiomediastinal silhouette is unremarkable.

There is no evidence of focal airspace disease, pulmonary edema,
suspicious pulmonary nodule/mass, pleural effusion, or pneumothorax.

No acute bony abnormalities are identified.
IMPRESSION: No active cardiopulmonary disease.

## 2023-03-23 ENCOUNTER — Other Ambulatory Visit: Payer: Self-pay | Admitting: Family Medicine

## 2023-03-23 DIAGNOSIS — K760 Fatty (change of) liver, not elsewhere classified: Secondary | ICD-10-CM
